# Patient Record
Sex: Male | Born: 1990 | Race: Black or African American | Hispanic: No | Marital: Single | Smoking: Current some day smoker
Health system: Southern US, Community
[De-identification: ages and names within clinical notes are randomized; demographics above are authoritative.]

## PROBLEM LIST (undated history)

## (undated) DIAGNOSIS — T7840XA Allergy, unspecified, initial encounter: Secondary | ICD-10-CM

## (undated) HISTORY — PX: STOMACH SURGERY: SHX791

## (undated) HISTORY — DX: Allergy, unspecified, initial encounter: T78.40XA

---

## 2013-05-17 ENCOUNTER — Ambulatory Visit (INDEPENDENT_AMBULATORY_CARE_PROVIDER_SITE_OTHER): Payer: BC Managed Care – PPO | Admitting: Physician Assistant

## 2013-05-17 VITALS — BP 118/74 | HR 68 | Temp 98.0°F | Resp 16 | Ht 70.0 in | Wt 154.0 lb

## 2013-05-17 DIAGNOSIS — Z113 Encounter for screening for infections with a predominantly sexual mode of transmission: Secondary | ICD-10-CM

## 2013-05-17 NOTE — Patient Instructions (Addendum)
I will let you know when your lab results are back and if we need to treat anything at that time   Safe Sex Safe sex is about reducing the risk of giving or getting a sexually transmitted disease (STD). STDs are spread through sexual contact involving the genitals, mouth, or rectum. Some STDS can be cured and others cannot. Safe sex can also prevent unintended pregnancies.  SAFE SEX PRACTICES  Limit your sexual activity to only one partner who is only having sex with you.  Talk to your partner about their past partners, past STDs, and drug use.  Use a condom every time you have sexual intercourse. This includes vaginal, oral, and anal sexual activity. Both females and males should wear condoms during oral sex. Only use latex or polyurethane condoms and water-based lubricants. Petroleum-based lubricants or oils used to lubricate a condom will weaken the condom and increase the chance that it will break. The condom should be in place from the beginning to the end of sexual activity. Wearing a condom reduces, but does not completely eliminate, your risk of getting or giving a STD. STDs can be spread by contact with skin of surrounding areas.  Get vaccinated for hepatitis B and HPV.  Avoid alcohol and recreational drugs which can affect your judgement. You may forget to use a condom or participate in high-risk sex.  For females, avoid douching after sexual intercourse. Douching can spread an infection farther into the reproductive tract.  Check your body for signs of sores, blisters, rashes, or unusual discharge. See your caregiver if you notice any of these signs.  Avoid sexual contact if you have symptoms of an infection or are being treated for an STD. If you or your partner has herpes, avoid sexual contact when blisters are present. Use condoms at all other times.  See your caregiver for regular screenings, examinations, and tests for STDs. Before having sex with a new partner, each of you  should be screened for STDs and talk about the results with your partner. BENEFITS OF SAFE SEX   There is less of a chance of getting or giving an STD.  You can prevent unwanted or unintended pregnancies.  By discussing safer sex concerns with your partner, you may increase feelings of intimacy, comfort, trust, and honesty between the both of you. Document Released: 01/21/2005 Document Revised: 09/07/2012 Document Reviewed: 06/06/2012 [facility] Patient Information 2014 [place], [place].

## 2013-05-17 NOTE — Progress Notes (Signed)
  Subjective:    Patient ID: Carl Oconnell, male    DOB: 10-08-1991, 22 y.o.   MRN: [id number]  HPI   Carl Oconnell is a pleasnat 22 yr old male here for STD testing.  States that about 2-3 weeks ago the condom broke during intercourse.  Pt currently has 1 male partner.  He does not know if his partner has been tested.  He denies pain with urination or discharge from the penis.  Denies rashes or lesions.  States he has maybe noticed a tingling sensation, but then states sometimes he overreacts in these situations.  Last tested in December, all negative, never tested positive.  Pt requests doxycycline today as he has been given this in the past.   Review of Systems  Genitourinary: Negative for dysuria, discharge, genital sores, penile pain and testicular pain.  All other systems reviewed and are negative.       Objective:   Physical Exam  Vitals reviewed. Constitutional: He is oriented to person, place, and time. He appears well-developed and well-nourished. No distress.  HENT:  Head: Normocephalic and atraumatic.  Eyes: Conjunctivae are normal. No scleral icterus.  Cardiovascular: Normal rate, regular rhythm and normal heart sounds.   Pulmonary/Chest: Effort normal and breath sounds normal. He has no wheezes. He has no rales.  Abdominal: Soft. There is no tenderness.  Neurological: He is alert and oriented to person, place, and time.  Skin: Skin is warm and dry.  Psychiatric: He has a normal mood and affect. His behavior is normal.          Assessment & Plan:  Screen for STD (sexually transmitted disease) - Plan: GC/Chlamydia Probe Amp, HIV antibody, RPR   Carl Oconnell is a 21 yr old male here for STD testing.  He is currently asymptomatic.  He does not have a known exposure.  For this reason I do not think he warrants treatment with doxycycline at this point.  Uriprobe, RPR, HIV all pending.  Will follow up on labs and treat if necessary.

## 2013-05-18 LAB — GC/CHLAMYDIA PROBE AMP: CT Probe RNA: NEGATIVE

## 2013-05-24 ENCOUNTER — Encounter: Payer: Self-pay | Admitting: Physician Assistant

## 2016-01-07 ENCOUNTER — Ambulatory Visit (INDEPENDENT_AMBULATORY_CARE_PROVIDER_SITE_OTHER): Payer: BLUE CROSS/BLUE SHIELD | Admitting: Physician Assistant

## 2016-01-07 VITALS — BP 120/70 | HR 82 | Temp 98.0°F | Resp 20 | Ht 70.0 in | Wt 176.6 lb

## 2016-01-07 DIAGNOSIS — Z113 Encounter for screening for infections with a predominantly sexual mode of transmission: Secondary | ICD-10-CM | POA: Diagnosis not present

## 2016-01-07 DIAGNOSIS — Z13228 Encounter for screening for other metabolic disorders: Secondary | ICD-10-CM | POA: Diagnosis not present

## 2016-01-07 DIAGNOSIS — Z1329 Encounter for screening for other suspected endocrine disorder: Secondary | ICD-10-CM

## 2016-01-07 DIAGNOSIS — Z0000 Encounter for general adult medical examination without abnormal findings: Secondary | ICD-10-CM

## 2016-01-07 DIAGNOSIS — Z130 Encounter for screening for diseases of the blood and blood-forming organs and certain disorders involving the immune mechanism: Secondary | ICD-10-CM

## 2016-01-07 LAB — COMPLETE METABOLIC PANEL WITH GFR
ALT: 24 U/L (ref 9–46)
AST: 22 U/L (ref 10–40)
Albumin: 4.5 g/dL (ref 3.6–5.1)
Alkaline Phosphatase: 85 U/L (ref 40–115)
BUN: 10 mg/dL (ref 7–25)
CALCIUM: 9.6 mg/dL (ref 8.6–10.3)
CHLORIDE: 101 mmol/L (ref 98–110)
CO2: 29 mmol/L (ref 20–31)
CREATININE: 1.38 mg/dL — AB (ref 0.60–1.35)
GFR, Est African American: 82 mL/min (ref >=60)
GFR, Est Non African American: 71 mL/min (ref >=60)
GLUCOSE: 100 mg/dL — AB (ref 65–99)
POTASSIUM: 4.6 mmol/L (ref 3.5–5.3)
SODIUM: 140 mmol/L (ref 135–146)
Total Bilirubin: 0.7 mg/dL (ref 0.2–1.2)
Total Protein: 6.9 g/dL (ref 6.1–8.1)

## 2016-01-07 LAB — CBC
HEMATOCRIT: 43.6 % (ref 39.0–52.0)
Hemoglobin: 14.4 g/dL (ref 13.0–17.0)
MCH: 27.3 pg (ref 26.0–34.0)
MCHC: 33 g/dL (ref 30.0–36.0)
MCV: 82.6 fL (ref 78.0–100.0)
MPV: 9.9 fL (ref 8.6–12.4)
Platelets: 238 10*3/uL (ref 150–400)
RBC: 5.28 MIL/uL (ref 4.22–5.81)
RDW: 13.9 % (ref 11.5–15.5)
WBC: 7.3 10*3/uL (ref 4.0–10.5)

## 2016-01-07 LAB — TSH: TSH: 1.546 u[IU]/mL (ref 0.350–4.500)

## 2016-01-07 NOTE — Progress Notes (Signed)
[facility] [address] [place] [address] [phone]  Date:  01/07/2016   Name:  Carl Oconnell   DOB:  1991/02/20   MRN:  [id number]  PCP:  No primary care provider on file.    History of Present Illness:  Carl Oconnell is a 25 y.o. male patient who presents to [facility] for an annual physical exam.     Diet: He is not eating pork.  Avoids fast food.  Eats more seafood and chicken.  Rare red meat.  Changed diet.  Drinks water--4-5 bottles per day.  Drinks almond milk  Exercise: slacking for 3 months.  Usually does 5-10 miles 3-4 times per week.    BM: Normal.  Every other day.  No blood in stool.  No constipation or diarrhea.  Urination: Normal.  No hematuria, dysuria.  Frequency due to hydrating.   Sleep: sleeps well. No problems sleeping or getting to sleep.  Takes vitamin (centrum)  Social activity: Advertising account executive.  [place]   Sexually.  Monogamous relationship.   There are no active problems to display for this patient.   Past Medical History  Diagnosis Date  . Allergy     Past Surgical History  Procedure Laterality Date  . Stomach surgery      Social History  Substance Use Topics  . Smoking status: Current Every Day Smoker  . Smokeless tobacco: None  . Alcohol Use: Yes    Family History  Problem Relation Age of Onset  . Diabetes Father   . Hypertension Father   . Cancer Maternal Grandmother     No Known Allergies  Medication list has been reviewed and updated.  No current outpatient prescriptions on file prior to visit.   No current facility-administered medications on file prior to visit.    Review of Systems  Constitutional: Negative for fever and chills.  HENT: Negative for ear discharge, ear pain and sore throat.   Eyes: Negative for blurred vision and double vision.  Respiratory: Negative for cough, shortness of breath and wheezing.   Cardiovascular: Negative for chest pain, palpitations and  leg swelling.  Gastrointestinal: Negative for nausea, vomiting and diarrhea.  Genitourinary: Negative for dysuria, frequency and hematuria.  Skin: Negative for itching and rash.  Neurological: Negative for dizziness and headaches.     Physical Examination: BP 120/70 mmHg  Pulse 82  Temp(Src) 98 F (36.7 C) (Oral)  Resp 20  Ht 5\' 10"  (1.778 m)  Wt 176 lb 9.6 oz (80.105 kg)  BMI 25.34 kg/m2  SpO2 98% Ideal Body Weight: Weight in (lb) to have BMI = 25: 173.9 Wt Readings from Last 3 Encounters:  01/07/16 176 lb 9.6 oz (80.105 kg)  05/17/13 154 lb (69.854 kg)    Physical Exam  Constitutional: He is oriented to person, place, and time. He appears well-developed and well-nourished. No distress.  HENT:  Head: Normocephalic and atraumatic.  Right Ear: Tympanic membrane, external ear and ear canal normal.  Left Ear: Tympanic membrane, external ear and ear canal normal.  Nose: Nose normal.  Mouth/Throat: Oropharynx is clear and moist. No oropharyngeal exudate.  Eyes: Conjunctivae and EOM are normal. Pupils are equal, round, and reactive to light. Right eye exhibits no discharge. Left eye exhibits no discharge.  Cardiovascular: Normal rate, regular rhythm, normal heart sounds and intact distal pulses.  Exam reveals no friction rub.   No murmur heard. Pulmonary/Chest: Effort normal and breath sounds normal. No apnea. No respiratory distress. He has  no decreased breath sounds. He has no wheezes. He has no rhonchi.  Abdominal: Soft. Bowel sounds are normal. He exhibits no distension and no mass. There is no tenderness.  Musculoskeletal: Normal range of motion. He exhibits no edema or tenderness.  Neurological: He is alert and oriented to person, place, and time. He has normal reflexes. He displays normal reflexes. No cranial nerve deficit. Coordination normal.  Skin: Skin is warm and dry. No rash noted. He is not diaphoretic. No erythema.  Psychiatric: He has a normal mood and affect. His  behavior is normal.     Assessment and Plan: Carl Crosbyristan Caliendo is a 25 y.o. male who is here today for an annual physical exam. General labs performed today. He declines flu vaccine at this time.  Annual physical exam - Plan: CBC, COMPLETE METABOLIC PANEL WITH GFR, TSH, RPR, GC/Chlamydia Probe Amp, HIV antibody, CANCELED: HIV 1 RNA quant-no reflex-bld  Screening for deficiency anemia - Plan: CBC  Screening for thyroid disorder - Plan: TSH  Screening for metabolic disorder - Plan: COMPLETE METABOLIC PANEL WITH GFR  Screening for STD (sexually transmitted disease) - Plan: RPR, GC/Chlamydia Probe Amp, HIV antibody, CANCELED: HIV 1 RNA quant-no reflex-bld   Trena PlattStephanie Snow Peoples, PA-C [facility] 01/07/2016 4:31 PM

## 2016-01-07 NOTE — Patient Instructions (Addendum)
Your physical exam was completely fine.  I will have the lab results within the next 7-10 days.  Keeping you healthy  Get these tests  Blood pressure- Have your blood pressure checked once a year by your healthcare provider.  Normal blood pressure is 120/80.  Weight- Have your body mass index (BMI) calculated to screen for obesity.  BMI is a measure of body fat based on height and weight. You can also calculate your own BMI at https://www.west-esparza.com/www.[email or web address]/.  Cholesterol- Have your cholesterol checked regularly starting at age 26, sooner may be necessary if you have diabetes, high blood pressure, if a family member developed heart diseases at an early age or if you smoke.   Chlamydia, HIV, and other sexual transmitted disease- Get screened each year until the age of 26 then within three months of each new sexual partner.  Diabetes- Have your blood sugar checked regularly if you have high blood pressure, high cholesterol, a family history of diabetes or if you are overweight.  Get these vaccines  Flu shot- Every fall.  Tetanus shot- Every 10 years.  Menactra- Single dose; prevents meningitis.  Take these steps  Don't smoke- If you do smoke, ask your healthcare provider about quitting. For tips on how to quit, go to www.smokefree.gov or call 1-800-QUIT-NOW.  Be physically active- Exercise 5 days a week for at least 30 minutes.  If you are not already physically active start slow and gradually work up to 30 minutes of moderate physical activity.  Examples of moderate activity include walking briskly, mowing the yard, dancing, swimming bicycling, etc.  Eat a healthy diet- Eat a variety of healthy foods such as fruits, vegetables, low fat milk, low fat cheese, yogurt, lean meats, poultry, fish, beans, tofu, etc.  For more information on healthy eating, go to www.thenutritionsource.org  Drink alcohol in moderation- Limit alcohol intake two drinks or less a day.  Never drink and  drive.  Dentist- Brush and floss teeth twice daily; visit your dentis twice a year.  Depression-Your emotional health is as important as your physical health.  If you're feeling down, losing interest in things you normally enjoy please talk with your healthcare provider.  Gun Safety- If you keep a gun in your home, keep it unloaded and with the safety lock on.  Bullets should be stored separately.  Helmet use- Always wear a helmet when riding a motorcycle, bicycle, rollerblading or skateboarding.  Safe sex- If you may be exposed to a sexually transmitted infection, use a condom  Seat belts- Seat bels can save your life; always wear one.  Smoke/Carbon Monoxide detectors- These detectors need to be installed on the appropriate level of your home.  Replace batteries at least once a year.  Skin Cancer- When out in the sun, cover up and use sunscreen SPF 15 or higher.  Violence- If anyone is threatening or hurting you, please tell your healthcare provider.

## 2016-01-08 ENCOUNTER — Encounter: Payer: Self-pay | Admitting: Physician Assistant

## 2016-01-08 LAB — GC/CHLAMYDIA PROBE AMP
CT PROBE, AMP APTIMA: NOT DETECTED
GC PROBE AMP APTIMA: NOT DETECTED

## 2016-01-08 LAB — HIV ANTIBODY (ROUTINE TESTING W REFLEX): HIV 1&2 Ab, 4th Generation: NONREACTIVE

## 2016-01-08 LAB — RPR

## 2016-02-04 ENCOUNTER — Emergency Department (HOSPITAL_COMMUNITY): Payer: BLUE CROSS/BLUE SHIELD

## 2016-02-04 ENCOUNTER — Emergency Department (HOSPITAL_COMMUNITY)
Admission: EM | Admit: 2016-02-04 | Discharge: 2016-02-04 | Disposition: A | Discharge status: Home / Self Care | Payer: BLUE CROSS/BLUE SHIELD | Attending: Emergency Medicine | Admitting: Emergency Medicine

## 2016-02-04 ENCOUNTER — Encounter (HOSPITAL_COMMUNITY): Payer: Self-pay | Admitting: Emergency Medicine

## 2016-02-04 DIAGNOSIS — T148XXA Other injury of unspecified body region, initial encounter: Secondary | ICD-10-CM

## 2016-02-04 DIAGNOSIS — Y92410 Unspecified street and highway as the place of occurrence of the external cause: Secondary | ICD-10-CM | POA: Insufficient documentation

## 2016-02-04 DIAGNOSIS — S60511A Abrasion of right hand, initial encounter: Secondary | ICD-10-CM | POA: Diagnosis not present

## 2016-02-04 DIAGNOSIS — Y9389 Activity, other specified: Secondary | ICD-10-CM | POA: Insufficient documentation

## 2016-02-04 DIAGNOSIS — Y998 Other external cause status: Secondary | ICD-10-CM | POA: Insufficient documentation

## 2016-02-04 DIAGNOSIS — Z79899 Other long term (current) drug therapy: Secondary | ICD-10-CM | POA: Diagnosis not present

## 2016-02-04 DIAGNOSIS — S3991XA Unspecified injury of abdomen, initial encounter: Secondary | ICD-10-CM | POA: Diagnosis present

## 2016-02-04 DIAGNOSIS — S20211A Contusion of right front wall of thorax, initial encounter: Secondary | ICD-10-CM

## 2016-02-04 DIAGNOSIS — S199XXA Unspecified injury of neck, initial encounter: Secondary | ICD-10-CM | POA: Insufficient documentation

## 2016-02-04 DIAGNOSIS — F17210 Nicotine dependence, cigarettes, uncomplicated: Secondary | ICD-10-CM | POA: Insufficient documentation

## 2016-02-04 DIAGNOSIS — S301XXA Contusion of abdominal wall, initial encounter: Secondary | ICD-10-CM | POA: Insufficient documentation

## 2016-02-04 MED ORDER — CYCLOBENZAPRINE HCL 5 MG PO TABS: 5.0000 mg | ORAL_TABLET | Freq: Three times a day (TID) | ORAL | Status: DC | PRN

## 2016-02-04 MED ORDER — IBUPROFEN 600 MG PO TABS: 600.0000 mg | ORAL_TABLET | Freq: Four times a day (QID) | ORAL | Status: DC | PRN

## 2016-02-04 MED ORDER — IOHEXOL 300 MG/ML  SOLN
100.0000 mL | Freq: Once | INTRAMUSCULAR | Status: AC | PRN
  Administered 2016-02-04: 100 mL via INTRAVENOUS

## 2016-02-04 MED ORDER — SODIUM CHLORIDE 0.9 % IV SOLN
Freq: Once | INTRAVENOUS | Status: AC
  Administered 2016-02-04: 21:00:00 via INTRAVENOUS
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
## 2016-02-04 NOTE — ED Notes (Signed)
Patient verbalized understanding of discharge instructions and denies any further needs or questions at this time. VS stable. Patient ambulatory with steady gait.  
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
## 2016-02-04 NOTE — ED Notes (Signed)
NP at bedside.

## 2016-02-04 NOTE — ED Notes (Signed)
Cleaned abrasion to R hand - applied bacitracin and bandage.
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
## 2016-02-05 ENCOUNTER — Encounter: Payer: Self-pay | Admitting: Physician Assistant

## 2016-02-06 ENCOUNTER — Encounter: Payer: Self-pay | Admitting: Physician Assistant

## 2016-02-06 ENCOUNTER — Ambulatory Visit (INDEPENDENT_AMBULATORY_CARE_PROVIDER_SITE_OTHER): Payer: BLUE CROSS/BLUE SHIELD

## 2016-02-06 ENCOUNTER — Ambulatory Visit (INDEPENDENT_AMBULATORY_CARE_PROVIDER_SITE_OTHER): Payer: BLUE CROSS/BLUE SHIELD | Admitting: Physician Assistant

## 2016-02-06 DIAGNOSIS — M62838 Other muscle spasm: Secondary | ICD-10-CM

## 2016-02-06 DIAGNOSIS — M62830 Muscle spasm of back: Secondary | ICD-10-CM | POA: Diagnosis not present

## 2016-02-06 DIAGNOSIS — M6248 Contracture of muscle, other site: Secondary | ICD-10-CM

## 2016-02-06 MED ORDER — NAPROXEN SODIUM 550 MG PO TABS: 550.0000 mg | ORAL_TABLET | Freq: Two times a day (BID) | ORAL | Status: DC

## 2016-02-06 MED ORDER — CYCLOBENZAPRINE HCL ER 15 MG PO CP24: 15.0000 mg | ORAL_CAPSULE | Freq: Every day | ORAL | Status: DC | PRN

## 2016-02-06 MED ORDER — CYCLOBENZAPRINE HCL ER 15 MG PO CP24
15.0000 mg | ORAL_CAPSULE | Freq: Every day | ORAL | Status: DC | PRN
Start: 2016-02-06 — End: 2016-04-25
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
## 2016-02-14 ENCOUNTER — Ambulatory Visit (INDEPENDENT_AMBULATORY_CARE_PROVIDER_SITE_OTHER): Payer: BLUE CROSS/BLUE SHIELD | Admitting: Family Medicine

## 2016-02-14 ENCOUNTER — Ambulatory Visit (INDEPENDENT_AMBULATORY_CARE_PROVIDER_SITE_OTHER): Payer: BLUE CROSS/BLUE SHIELD

## 2016-02-14 VITALS — BP 110/80 | HR 60 | Temp 98.1°F | Resp 18 | Ht 69.75 in | Wt 173.2 lb

## 2016-02-14 DIAGNOSIS — M542 Cervicalgia: Secondary | ICD-10-CM

## 2016-02-14 DIAGNOSIS — M25511 Pain in right shoulder: Secondary | ICD-10-CM

## 2016-02-14 DIAGNOSIS — M25552 Pain in left hip: Secondary | ICD-10-CM | POA: Diagnosis not present
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
## 2016-02-14 NOTE — Patient Instructions (Addendum)
Because you received an x-ray today, you will receive an invoice from [facility]. Please contact [facility] at [phone] with questions or concerns regarding your invoice. Our billing staff will not be able to assist you with those questions.   Referral has been made to [facility] orthopedic Associates Dr. Eula Listen, sports medicine, to evaluate your shoulder.  Avoid lifting or heavy straining with the right shoulder, but otherwise can resume regular activities.

## 2016-04-25 ENCOUNTER — Ambulatory Visit (INDEPENDENT_AMBULATORY_CARE_PROVIDER_SITE_OTHER): Payer: Self-pay | Admitting: Family Medicine

## 2016-04-25 VITALS — BP 118/76 | HR 74 | Temp 98.1°F | Resp 16 | Ht 69.0 in | Wt 170.0 lb

## 2016-04-25 DIAGNOSIS — N342 Other urethritis: Secondary | ICD-10-CM

## 2016-04-25 LAB — HIV ANTIBODY (ROUTINE TESTING W REFLEX): HIV: NONREACTIVE

## 2016-04-25 MED ORDER — CEFTRIAXONE SODIUM 1 G IJ SOLR
250.0000 mg | Freq: Once | INTRAMUSCULAR | Status: AC
  Administered 2016-04-25: 250 mg via INTRAMUSCULAR

## 2016-04-25 MED ORDER — AZITHROMYCIN 250 MG PO TABS: 1000.0000 mg | ORAL_TABLET | Freq: Once | ORAL | Status: AC

## 2016-04-25 NOTE — Progress Notes (Signed)
Carl Oconnell is a 25 y.o. male who presents to Ur[facility] Care today for urethritis. Patient notes a 3 day history of pain and irritation at the tip of the penis. He does also note penile discharge. He denies any dysuria fevers chills nausea vomiting or diarrhea. He has never had anything like this happen before. He feels well otherwise.   Past Medical History  Diagnosis Date  . Allergy    Past Surgical History  Procedure Laterality Date  . Stomach surgery     Social History  Substance Use Topics  . Smoking status: Current Some Day Smoker    Types: Cigars  . Smokeless tobacco: Not on file  . Alcohol Use: Yes     Comment: occ   ROS as above Medications: Current Outpatient Prescriptions  Medication Sig Dispense Refill  . Multiple Vitamin (MULTIVITAMIN WITH MINERALS) TABS tablet Take 1 tablet by mouth daily.    Marland Kitchen. azithromycin (ZITHROMAX) 250 MG tablet Take 4 tablets (1,000 mg total) by mouth once. 4 each 0   Current Facility-Administered Medications  Medication Dose Route Frequency Provider Last Rate Last Dose  . cefTRIAXone (ROCEPHIN) injection 250 mg  250 mg Intramuscular Once Rodolph BongEvan S Corey, MD       No Known Allergies   Exam:  BP 118/76 mmHg  Pulse 74  Temp(Src) 98.1 F (36.7 C) (Oral)  Resp 16  Ht 5\' 9"  (1.753 m)  Wt 170 lb (77.111 kg)  BMI 25.09 kg/m2  SpO2 98% Gen: Well NAD Genitals: No inguinal lymphadenopathy. Testicles are descended bilaterally without masses or tenderness. Penis is circumcised and normal appearing with no lesions or significant discharge.  Patient was given 250 IM ceftriaxone prior to discharge  No results found for this or any previous visit (from the past 24 hour(s)). No results found.  Assessment and Plan: 25 y.o. male with urethritis. HIV RPR GC chlamydia pending. Empiric treatment with ceftriaxone IM prior to discharge and a prescription for 1 g of azithromycin to be taken once. Will call patient with results. Return as  needed.  Discussed warning signs or symptoms. Please see discharge instructions. Patient expresses understanding.

## 2016-04-25 NOTE — Patient Instructions (Addendum)
  Thank you for coming in today. Get labs today.  Get Rx from pharmacy.  Return as needed.  You will soon hear from me about your lab results likely Monday or Tuesday.     IF you received an x-ray today, you will receive an invoice from [facility]. Please contact [facility] at [phone] with questions or concerns regarding your invoice.   IF you received labwork today, you will receive an invoice from [facility]. Please contact [facility] at [phone] with questions or concerns regarding your invoice.   Our billing staff will not be able to assist you with questions regarding bills from these companies.  You will be contacted with the lab results as soon as they are available. The fastest way to get your results is to activate your My Chart account. Instructions are located on the last page of this paperwork. If you have not heard from us regarding the results in 2 weeks, please contact this office.

## 2016-04-27 LAB — GC/CHLAMYDIA PROBE AMP
CT PROBE, AMP APTIMA: NOT DETECTED
GC Probe RNA: DETECTED — AB

## 2016-04-28 LAB — RPR
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
